# Patient Record
Sex: Male | Born: 2000 | Race: White | Hispanic: No | Marital: Single | Smoking: Never smoker
Health system: Southern US, Community
[De-identification: ages and names within clinical notes are randomized; demographics above are authoritative.]

## PROBLEM LIST (undated history)

## (undated) DIAGNOSIS — N5089 Other specified disorders of the male genital organs: Secondary | ICD-10-CM

## (undated) HISTORY — DX: Other specified disorders of the male genital organs: N50.89

---
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
## 2021-01-07 ENCOUNTER — Ambulatory Visit (INDEPENDENT_AMBULATORY_CARE_PROVIDER_SITE_OTHER): Payer: BC Managed Care – PPO | Admitting: Physician Assistant

## 2021-01-07 ENCOUNTER — Other Ambulatory Visit: Payer: Self-pay

## 2021-01-07 ENCOUNTER — Encounter: Payer: Self-pay | Admitting: Physician Assistant

## 2021-01-07 VITALS — BP 109/70 | HR 70 | Temp 98.3°F | Ht 72.0 in | Wt 174.5 lb

## 2021-01-07 DIAGNOSIS — Z025 Encounter for examination for participation in sport: Secondary | ICD-10-CM

## 2021-01-07 NOTE — Progress Notes (Signed)
Male sports physical   Impression and Recommendations:    1. Routine sports physical exam       1) Healthcare Maintenance: -Completed sports physical form. -Recommend to continue with reducing of vaping and eventually avoid use. -Recommend to avoid etoh use. -Continue to stay hydrated. -Follow up for annual physical in 6-12 months.    No orders of the defined types were placed in this encounter.   No orders of the defined types were placed in this encounter.    Return for CPE in 6-12 months.    Gross side effects, risk and benefits, and alternatives of medications discussed with patient.  Patient is aware that all medications have potential side effects and we are unable to predict every side effect or drug-drug interaction that may occur.  Expresses verbal understanding and consents to current therapy plan and treatment regimen.  Please see AVS handed out to patient at the end of our visit for further patient instructions/ counseling done pertaining to today's office visit.       Subjective:        CC: CPE   HPI: Phillip Martin is a 20 y.o. male who presents to [place] [facility] today for sports physical exam.     Health Maintenance Summary  - Reviewed and updated, unless pt declines services.  Tobacco History Reviewed:   Occasionally vapes Alcohol / drug use:    No concerns, reports will have beer or liquor at parties/ no use Exercise Habits:   Moderate to rigorous physical activity Male history: STD concerns:   none Additional penile/ urinary concerns: None   Additional concerns beyond Health Maintenance issues:   None    Immunization History  Administered Date(s) Administered   DTaP 12/26/2001, 02/13/2002, 05/21/2002, 05/27/2003, 03/07/2006   HPV 9-valent 01/21/2016, 08/03/2016, 06/21/2017   Hepatitis A 03/07/2006, 08/21/2007   Hepatitis B 2001-07-26, 11/22/2001, 08/28/2002   HiB (PRP-OMP) 12/26/2001, 02/13/2002,  05/21/2002, 10/23/2002   HiB (PRP-T) 12/26/2001, 02/13/2002, 05/21/2002, 10/23/2002   Hpv-Unspecified 01/21/2016, 08/03/2016   IPV 12/26/2001, 02/13/2002, 05/21/2002, 03/07/2006   Influenza,inj,Quad PF,6+ Mos 03/23/2016, 06/21/2017, 03/07/2019, 05/21/2020   Influenza-Unspecified 06/14/2013   MMR 10/23/2002, 03/07/2006   Meningococcal B, OMV 01/10/2018, 05/21/2020   Meningococcal Conjugate 01/04/2014   Pneumococcal Conjugate-13 12/26/2001, 02/13/2002, 05/21/2002, 05/27/2003   Pneumococcal-Unspecified 12/26/2001, 02/13/2002, 05/21/2002, 05/27/2003   Tdap 04/04/2012   Varicella 10/23/2002, 03/07/2006     Health Maintenance  Topic Date Due   HIV Screening  Never done   Hepatitis C Screening  Never done   INFLUENZA VACCINE  01/05/2021   TETANUS/TDAP  04/04/2022   Pneumococcal Vaccine 27-37 Years old  Completed   HPV VACCINES  Completed       Wt Readings from Last 3 Encounters:  01/07/21 174 lb 8 oz (79.2 kg) (78 %, Z= 0.76)*  05/21/20 166 lb 14.4 oz (75.7 kg) (73 %, Z= 0.60)*  03/07/19 165 lb 14.4 oz (75.3 kg) (78 %, Z= 0.78)*   * Growth percentiles are based on CDC (Boys, 2-20 Years) data.   BP Readings from Last 3 Encounters:  01/07/21 109/70  05/21/20 106/69  03/07/19 120/74 (59 %, Z = 0.23 /  70 %, Z = 0.52)*   *BP percentiles are based on the 2017 AAP Clinical Practice Guideline for boys   Pulse Readings from Last 3 Encounters:  01/07/21 70  05/21/20 (!) 50  03/07/19 61    Patient Active Problem List   Diagnosis Date Noted  Cough 02/13/2018   Health check for child over 18 days old 01/10/2018   Healthcare maintenance 06/21/2017    Past Medical History:  Diagnosis Date   Spermatic cord cyst     Past Surgical History:  Procedure Laterality Date   WISDOM TOOTH EXTRACTION      Family History  Problem Relation Age of Onset   Healthy Mother    Healthy Father    Healthy Brother    Heart attack Maternal Grandmother    Diabetes Maternal Grandmother      Social History   Substance and Sexual Activity  Drug Use No  ,  Social History   Substance and Sexual Activity  Alcohol Use No  ,  Social History   Tobacco Use  Smoking Status Never  Smokeless Tobacco Never  ,  Social History   Substance and Sexual Activity  Sexual Activity Yes   Birth control/protection: Condom    Patient's Medications   No medications on file    Patient has no known allergies.  Review of Systems: General:   Denies fever, chills, unexplained weight loss.  Optho/Auditory:   Denies visual changes, blurred vision/LOV Respiratory:   Denies SOB, DOE more than baseline levels.   Cardiovascular:   Denies chest pain, palpitations, new onset peripheral edema  Gastrointestinal:   Denies nausea, vomiting, diarrhea.  Genitourinary: Denies dysuria, freq/ urgency, flank pain or discharge from genitals.  Endocrine:     Denies hot or cold intolerance, polyuria, polydipsia. Musculoskeletal:   Denies unexplained myalgias, joint swelling, unexplained arthralgias, gait problems.  Skin:  Denies rash, suspicious lesions Neurological:     Denies dizziness, unexplained weakness, numbness  Psychiatric/Behavioral:   Denies mood changes, suicidal or homicidal ideations, hallucinations    Objective:     Blood pressure 109/70, pulse 70, temperature 98.3 F (36.8 C), height 6' (1.829 m), weight 174 lb 8 oz (79.2 kg), SpO2 98 %. Body mass index is 23.67 kg/m. General Appearance:    Alert, cooperative, no distress, appears stated age  Head:    Normocephalic, without obvious abnormality, atraumatic  Eyes:    PERRL, conjunctiva/corneas clear, EOM's intact, fundi    benign, both eyes  Ears:    Normal TM's and external ear canals, both ears  Nose:   Nares normal, septum midline, mucosa normal, no drainage    or sinus tenderness  Throat:   Lips w/o lesion, mucosa moist, and tongue normal; teeth and gums normal  Neck:   Supple, symmetrical, trachea midline, no adenopathy;     thyroid:  no enlargement/tenderness  Back:     Symmetric, no curvature, ROM normal, no CVA tenderness  Lungs:     Clear to auscultation bilaterally, respirations unlabored, no Wh/ R/ R  Chest Wall:    No tenderness or gross deformity; normal excursion   Heart:    Regular rate and rhythm, S1 and S2 normal, no murmur, rub   or gallop  Abdomen:     Soft, non-tender, bowel sounds active all four quadrants, No G/R/R, no masses, no organomegaly  Genitalia:    Ext genitalia: without lesion, no penile rash or discharge, no hernias appreciated, epididymal cyst (R>L) stable  Rectal:  Deferred  Extremities:   Extremities normal, atraumatic, no cyanosis or gross edema  Pulses:   2+ and symmetric all extremities  Skin:   Warm, dry, Skin color, texture, turgor normal, no obvious rashes or lesions  M-Sk:   Ambulates * 4 w/o difficulty, no gross deformities, tone WNL  Neurologic:  CNII-XII intact, normal strength, sensation and reflexes    Throughout Psych:  No HI/SI, judgement and insight good, Euthymic mood. Full Affect.

## 2021-01-13 ENCOUNTER — Other Ambulatory Visit: Payer: Self-pay

## 2021-01-13 ENCOUNTER — Other Ambulatory Visit: Payer: Self-pay | Admitting: Physician Assistant

## 2021-01-13 ENCOUNTER — Other Ambulatory Visit: Payer: BC Managed Care – PPO

## 2021-01-13 DIAGNOSIS — Z130 Encounter for screening for diseases of the blood and blood-forming organs and certain disorders involving the immune mechanism: Secondary | ICD-10-CM

## 2021-01-15 LAB — SICKLE CELL SCREEN: Sickle Cell Screen: NEGATIVE

## 2022-01-11 ENCOUNTER — Encounter: Payer: Self-pay | Admitting: Physician Assistant

## 2022-01-11 ENCOUNTER — Telehealth: Payer: Self-pay | Admitting: Physician Assistant

## 2022-01-11 ENCOUNTER — Ambulatory Visit (INDEPENDENT_AMBULATORY_CARE_PROVIDER_SITE_OTHER): Payer: BC Managed Care – PPO | Admitting: Physician Assistant

## 2022-01-11 VITALS — BP 117/75 | HR 160 | Temp 97.8°F | Ht 72.0 in | Wt 173.0 lb

## 2022-01-11 DIAGNOSIS — Z0001 Encounter for general adult medical examination with abnormal findings: Secondary | ICD-10-CM | POA: Diagnosis not present

## 2022-01-11 DIAGNOSIS — Z0000 Encounter for general adult medical examination without abnormal findings: Secondary | ICD-10-CM | POA: Diagnosis not present

## 2022-01-11 DIAGNOSIS — R000 Tachycardia, unspecified: Secondary | ICD-10-CM

## 2022-01-11 NOTE — Patient Instructions (Signed)
Preventive Care 21-20 Years Old, Male Preventive care refers to lifestyle choices and visits with your health care provider that can promote health and wellness. Preventive care visits are also called wellness exams. What can I expect for my preventive care visit? Counseling During your preventive care visit, your health care provider may ask about your: Medical history, including: Past medical problems. Family medical history. Current health, including: Emotional well-being. Home life and relationship well-being. Sexual activity. Lifestyle, including: Alcohol, nicotine or tobacco, and drug use. Access to firearms. Diet, exercise, and sleep habits. Safety issues such as seatbelt and bike helmet use. Sunscreen use. Work and work environment. Physical exam Your health care provider may check your: Height and weight. These may be used to calculate your BMI (body mass index). BMI is a measurement that tells if you are at a healthy weight. Waist circumference. This measures the distance around your waistline. This measurement also tells if you are at a healthy weight and may help predict your risk of certain diseases, such as type 2 diabetes and high blood pressure. Heart rate and blood pressure. Body temperature. Skin for abnormal spots. What immunizations do I need?  Vaccines are usually given at various ages, according to a schedule. Your health care provider will recommend vaccines for you based on your age, medical history, and lifestyle or other factors, such as travel or where you work. What tests do I need? Screening Your health care provider may recommend screening tests for certain conditions. This may include: Lipid and cholesterol levels. Diabetes screening. This is done by checking your blood sugar (glucose) after you have not eaten for a while (fasting). Hepatitis B test. Hepatitis C test. HIV (human immunodeficiency virus) test. STI (sexually transmitted infection)  testing, if you are at risk. Talk with your health care provider about your test results, treatment options, and if necessary, the need for more tests. Follow these instructions at home: Eating and drinking  Eat a healthy diet that includes fresh fruits and vegetables, whole grains, lean protein, and low-fat dairy products. Drink enough fluid to keep your urine pale yellow. Take vitamin and mineral supplements as recommended by your health care provider. Do not drink alcohol if your health care provider tells you not to drink. If you drink alcohol: Limit how much you have to 0-2 drinks a day. Know how much alcohol is in your drink. In the [place] one drink equals one 12 oz bottle of beer (355 mL), one 5 oz glass of wine (148 mL), or one 1 oz glass of hard liquor (44 mL). Lifestyle Brush your teeth every morning and night with fluoride toothpaste. Floss one time each day. Exercise for at least 30 minutes 5 or more days each week. Do not use any products that contain nicotine or tobacco. These products include cigarettes, chewing tobacco, and vaping devices, such as e-cigarettes. If you need help quitting, ask your health care provider. Do not use drugs. If you are sexually active, practice safe sex. Use a condom or other form of protection to prevent STIs. Find healthy ways to manage stress, such as: Meditation, yoga, or listening to music. Journaling. Talking to a trusted person. Spending time with friends and family. Minimize exposure to UV radiation to reduce your risk of skin cancer. Safety Always wear your seat belt while driving or riding in a vehicle. Do not drive: If you have been drinking alcohol. Do not ride with someone who has been drinking. If you have been using any mind-altering substances   or drugs. While texting. When you are tired or distracted. Wear a helmet and other protective equipment during sports activities. If you have firearms in your house, make sure you  follow all gun safety procedures. Seek help if you have been physically or sexually abused. What's next? Go to your health care provider once a year for an annual wellness visit. Ask your health care provider how often you should have your eyes and teeth checked. Stay up to date on all vaccines. This information is not intended to replace advice given to you by your health care provider. Make sure you discuss any questions you have with your health care provider. Document Revised: 11/19/2020 Document Reviewed: 11/19/2020 Elsevier Patient Education  2023 Elsevier Inc.  

## 2022-01-11 NOTE — Telephone Encounter (Signed)
Patient's mother had left a vm while we were at lunch stating she wants to speak to someone about his diagnosis of tachycardia. She states he had just got done working out this morning before the appointment and he may or may not had caffeine prior to visit and she just doesn't want it to be on his "record" if it's not a true diagnosis. Please advise.

## 2022-01-11 NOTE — Telephone Encounter (Signed)
Patient was made fully aware and verbalized his understanding. Mother was on speaker phone with him and he approved it was ok to discuss findings. He returns to college tomorrow and he stated he would have to find a cardiologist near there.

## 2022-01-11 NOTE — Progress Notes (Signed)
Complete physical exam   Patient: Phillip Martin   DOB: Mar 24, 2001   21 y.o. Male  MRN: [id number] Visit Date: 01/11/2022   Chief Complaint  Patient presents with   Follow-up    physcial   Subjective    Phillip Martin is a 21 y.o. male who presents today for a complete physical exam.  He reports consuming a general diet. Gym/ health club routine includes cardio and strength training. He generally feels well. He does not have additional problems to discuss today.     Past Medical History:  Diagnosis Date   Spermatic cord cyst    Past Surgical History:  Procedure Laterality Date   WISDOM TOOTH EXTRACTION     Social History   Socioeconomic History   Marital status: Single    Spouse name: Not on file   Number of children: Not on file   Years of education: Not on file   Highest education level: Not on file  Occupational History   Not on file  Tobacco Use   Smoking status: Never   Smokeless tobacco: Never  Vaping Use   Vaping Use: Some days   Substances: Nicotine  Substance and Sexual Activity   Alcohol use: No   Drug use: No   Sexual activity: Yes    Birth control/protection: Condom  Other Topics Concern   Not on file  Social History Narrative   Not on file   Social Determinants of Health   Financial Resource Strain: Not on file  Food Insecurity: Not on file  Transportation Needs: Not on file  Physical Activity: Not on file  Stress: Not on file  Social Connections: Not on file  Intimate Partner Violence: Not on file     Medications: No outpatient medications prior to visit.   No facility-administered medications prior to visit.    Review of Systems Review of Systems:  A fourteen system review of systems was performed and found to be positive as per HPI.   Last CBC Lab Results  Component Value Date   WBC 8.8 12/13/2017   HGB 14.9 12/13/2017   HCT 42.9 12/13/2017   MCV 86 12/13/2017   MCH 29.7 12/13/2017   RDW 11.9 (L) 12/13/2017   PLT 263  52/84/1324   Last metabolic panel Lab Results  Component Value Date   GLUCOSE 91 12/13/2017   NA 141 12/13/2017   K 4.5 12/13/2017   CL 103 12/13/2017   CO2 24 12/13/2017   BUN 15 12/13/2017   CREATININE 0.88 12/13/2017   GFRNONAA CANCELED 12/13/2017   CALCIUM 9.8 12/13/2017   PROT 6.9 12/13/2017   ALBUMIN 4.4 12/13/2017   LABGLOB 2.5 12/13/2017   AGRATIO 1.8 12/13/2017   BILITOT 0.4 12/13/2017   ALKPHOS 108 12/13/2017   AST 29 12/13/2017   ALT 23 12/13/2017   Last lipids No results found for: "CHOL", "HDL", "LDLCALC", "LDLDIRECT", "TRIG", "CHOLHDL" Last hemoglobin A1c No results found for: "HGBA1C" Last thyroid functions No results found for: "TSH", "T3TOTAL", "T4TOTAL", "THYROIDAB" Last vitamin D No results found for: "25OHVITD2", "25OHVITD3", "VD25OH"  Objective     BP 117/75   Pulse (!) 160   Temp 97.8 F (36.6 C) (Temporal)   Ht 6' (1.829 m)   Wt 173 lb (78.5 kg)   BMI 23.46 kg/m  BP Readings from Last 3 Encounters:  01/11/22 117/75  01/07/21 109/70  05/21/20 106/69   Wt Readings from Last 3 Encounters:  01/11/22 173 lb (78.5 kg)  01/07/21 174 lb 8 oz (  79.2 kg) (78 %, Z= 0.76)*  05/21/20 166 lb 14.4 oz (75.7 kg) (73 %, Z= 0.60)*   * Growth percentiles are based on CDC (Boys, 2-20 Years) data.    Physical Exam   General Appearance:    Well developed, well nourished male. Alert, cooperative, in no acute distress, appears stated age  Head:    Normocephalic, without obvious abnormality, atraumatic  Eyes:    PERRL, conjunctiva/corneas clear, EOM's intact, fundi    benign, both eyes       Ears:    Normal TM's and external ear canals, both ears  Nose:   Nares normal, septum midline, mucosa normal, no drainage   or sinus tenderness  Throat:   Lips, mucosa, and tongue normal; teeth and gums normal  Neck:   Supple, symmetrical, trachea midline, no adenopathy;       thyroid:  No enlargement/tenderness/nodules; no JVD  Back:     Symmetric, no curvature, ROM  normal, no CVA tenderness  Lungs:     Clear to auscultation bilaterally, respirations unlabored  Chest wall:    No tenderness or deformity  Heart:    Tachycardic. Irregular rhythm. No murmurs, rubs, or gallops.  S1 and S2 normal  Abdomen:     Soft, non-tender, bowel sounds active all four quadrants,    no masses, no organomegaly  Genitalia:    deferred  Rectal:    deferred  Extremities:   All extremities are intact. No cyanosis or edema  Pulses:   2+ and symmetric all extremities  Skin:   Skin color, texture, turgor normal, no rashes or lesions  Lymph nodes:   Cervical and supraclavicular nodes normal  Neurologic:   CNII-XII grossly intact.     Last depression screening scores    01/11/2022    9:58 AM 01/07/2021    1:31 PM 05/21/2020    8:11 AM  PHQ 2/9 Scores  PHQ - 2 Score 0 0 0  PHQ- 9 Score 0 0 0   Last fall risk screening    01/11/2022    9:58 AM  Fall Risk   Falls in the past year? 0  Number falls in past yr: 0  Injury with Fall? 0  Risk for fall due to : No Fall Risks  Follow up Falls evaluation completed     No results found for any visits on 01/11/22.  Assessment & Plan    Routine Health Maintenance and Physical Exam  Exercise Activities and Dietary recommendations -Discussed heart healthy diet low in fat and carbohydrates. Recommend moderate exercise 150 mins/wk.  Immunization History  Administered Date(s) Administered   DTaP 12/26/2001, 02/13/2002, 05/21/2002, 05/27/2003, 03/07/2006   HPV 9-valent 01/21/2016, 08/03/2016, 06/21/2017   Hepatitis A 03/07/2006, 08/21/2007   Hepatitis B 09/09/2001, 11/22/2001, 08/28/2002   HiB (PRP-OMP) 12/26/2001, 02/13/2002, 05/21/2002, 10/23/2002   HiB (PRP-T) 12/26/2001, 02/13/2002, 05/21/2002, 10/23/2002   Hpv-Unspecified 01/21/2016, 08/03/2016   IPV 12/26/2001, 02/13/2002, 05/21/2002, 03/07/2006   Influenza,inj,Quad PF,6+ Mos 03/23/2016, 06/21/2017, 03/07/2019, 05/21/2020   Influenza-Unspecified 06/14/2013   MMR  10/23/2002, 03/07/2006   Meningococcal B, OMV 01/10/2018, 05/21/2020   Meningococcal Conjugate 01/04/2014   Pneumococcal Conjugate-13 12/26/2001, 02/13/2002, 05/21/2002, 05/27/2003   Pneumococcal-Unspecified 12/26/2001, 02/13/2002, 05/21/2002, 05/27/2003   Tdap 04/04/2012   Varicella 10/23/2002, 03/07/2006    Health Maintenance  Topic Date Due   HIV Screening  Never done   Hepatitis C Screening  Never done   INFLUENZA VACCINE  01/05/2022   TETANUS/TDAP  04/04/2022   HPV VACCINES  Completed  Discussed health benefits of physical activity, and encouraged him to engage in regular exercise appropriate for his age and condition.  Problem List Items Addressed This Visit   None Visit Diagnoses     Encounter for annual general medical examination with abnormal findings in adult    -  Primary   Tachycardia       Relevant Orders   EKG 12-Lead      EKG obtained for elevated pulse and slight irregular rhythm on exam. Patient is asymptomatic- denies cardiac or pulm symptoms. Discussed with patient EKG findings: NSR with sinus arrhythmia, rightward axis, rate 72 bpm, no acute ST-T wave changes. Discussed NSR with sinus arrhythmia could be a normal variation for age but recommend further cardiology work-up. Provided conditional clearance to participate in sports limiting strenuous/rigorous activity until cleared by cardiology. Patient is leaving to [employer] college tomorrow so advised to let me know where to send cardiology referral if needed. Patient denies use of steroids, work-out supplements, or excessive caffeine intake. Does report vape use and advised to abstain.   Return in about 1 year (around 01/12/2023) for CPE.       Lorrene Reid, PA-C  [facility] [phone] (phone) [phone] (fax)  [place]

## 2022-01-14 NOTE — Telephone Encounter (Signed)
Patient aware.

## 2022-01-14 NOTE — Telephone Encounter (Signed)
Please have patient find a cardiology office near his campus and advise where he would like referral sent.

## 2023-04-28 DIAGNOSIS — M238X9 Other internal derangements of unspecified knee: Secondary | ICD-10-CM | POA: Diagnosis not present

## 2023-04-28 DIAGNOSIS — M2391 Unspecified internal derangement of right knee: Secondary | ICD-10-CM | POA: Diagnosis not present

## 2023-04-28 DIAGNOSIS — M25461 Effusion, right knee: Secondary | ICD-10-CM | POA: Diagnosis not present

## 2023-04-28 DIAGNOSIS — M25361 Other instability, right knee: Secondary | ICD-10-CM | POA: Diagnosis not present

## 2023-04-28 DIAGNOSIS — S8991XA Unspecified injury of right lower leg, initial encounter: Secondary | ICD-10-CM | POA: Diagnosis not present

## 2023-05-03 DIAGNOSIS — M2391 Unspecified internal derangement of right knee: Secondary | ICD-10-CM | POA: Diagnosis not present

## 2023-05-03 DIAGNOSIS — M898X6 Other specified disorders of bone, lower leg: Secondary | ICD-10-CM | POA: Diagnosis not present

## 2023-05-03 DIAGNOSIS — S8991XA Unspecified injury of right lower leg, initial encounter: Secondary | ICD-10-CM | POA: Diagnosis not present

## 2023-05-13 DIAGNOSIS — S82831A Other fracture of upper and lower end of right fibula, initial encounter for closed fracture: Secondary | ICD-10-CM | POA: Diagnosis not present

## 2023-05-13 DIAGNOSIS — S8001XA Contusion of right knee, initial encounter: Secondary | ICD-10-CM | POA: Diagnosis not present

## 2023-05-13 DIAGNOSIS — S83421A Sprain of lateral collateral ligament of right knee, initial encounter: Secondary | ICD-10-CM | POA: Diagnosis not present

## 2023-05-13 DIAGNOSIS — S83411A Sprain of medial collateral ligament of right knee, initial encounter: Secondary | ICD-10-CM | POA: Diagnosis not present

## 2023-05-18 DIAGNOSIS — S8291XA Unspecified fracture of right lower leg, initial encounter for closed fracture: Secondary | ICD-10-CM | POA: Diagnosis not present

## 2023-05-26 ENCOUNTER — Ambulatory Visit: Payer: BC Managed Care – PPO | Admitting: Orthopedic Surgery

## 2023-05-31 DIAGNOSIS — M25661 Stiffness of right knee, not elsewhere classified: Secondary | ICD-10-CM | POA: Diagnosis not present

## 2023-05-31 DIAGNOSIS — M6281 Muscle weakness (generalized): Secondary | ICD-10-CM | POA: Diagnosis not present

## 2023-05-31 DIAGNOSIS — M25561 Pain in right knee: Secondary | ICD-10-CM | POA: Diagnosis not present

## 2023-06-02 DIAGNOSIS — M6281 Muscle weakness (generalized): Secondary | ICD-10-CM | POA: Diagnosis not present

## 2023-06-02 DIAGNOSIS — M25661 Stiffness of right knee, not elsewhere classified: Secondary | ICD-10-CM | POA: Diagnosis not present

## 2023-06-02 DIAGNOSIS — M25561 Pain in right knee: Secondary | ICD-10-CM | POA: Diagnosis not present

## 2023-06-06 DIAGNOSIS — M25561 Pain in right knee: Secondary | ICD-10-CM | POA: Diagnosis not present

## 2023-06-06 DIAGNOSIS — M25661 Stiffness of right knee, not elsewhere classified: Secondary | ICD-10-CM | POA: Diagnosis not present

## 2023-06-06 DIAGNOSIS — M6281 Muscle weakness (generalized): Secondary | ICD-10-CM | POA: Diagnosis not present

## 2023-06-09 DIAGNOSIS — M25661 Stiffness of right knee, not elsewhere classified: Secondary | ICD-10-CM | POA: Diagnosis not present

## 2023-06-09 DIAGNOSIS — M25561 Pain in right knee: Secondary | ICD-10-CM | POA: Diagnosis not present

## 2023-06-09 DIAGNOSIS — M6281 Muscle weakness (generalized): Secondary | ICD-10-CM | POA: Diagnosis not present

## 2023-06-15 DIAGNOSIS — S8291XA Unspecified fracture of right lower leg, initial encounter for closed fracture: Secondary | ICD-10-CM | POA: Diagnosis not present

## 2023-06-21 DIAGNOSIS — X58XXXD Exposure to other specified factors, subsequent encounter: Secondary | ICD-10-CM | POA: Diagnosis not present

## 2023-06-21 DIAGNOSIS — S8291XD Unspecified fracture of right lower leg, subsequent encounter for closed fracture with routine healing: Secondary | ICD-10-CM | POA: Diagnosis not present

## 2023-06-21 DIAGNOSIS — M79604 Pain in right leg: Secondary | ICD-10-CM | POA: Diagnosis not present

## 2023-06-28 DIAGNOSIS — X58XXXD Exposure to other specified factors, subsequent encounter: Secondary | ICD-10-CM | POA: Diagnosis not present

## 2023-06-28 DIAGNOSIS — M79604 Pain in right leg: Secondary | ICD-10-CM | POA: Diagnosis not present

## 2023-06-28 DIAGNOSIS — S8291XD Unspecified fracture of right lower leg, subsequent encounter for closed fracture with routine healing: Secondary | ICD-10-CM | POA: Diagnosis not present

## 2023-07-01 DIAGNOSIS — M79604 Pain in right leg: Secondary | ICD-10-CM | POA: Diagnosis not present

## 2023-07-01 DIAGNOSIS — X58XXXD Exposure to other specified factors, subsequent encounter: Secondary | ICD-10-CM | POA: Diagnosis not present

## 2023-07-01 DIAGNOSIS — S8291XD Unspecified fracture of right lower leg, subsequent encounter for closed fracture with routine healing: Secondary | ICD-10-CM | POA: Diagnosis not present

## 2023-07-05 DIAGNOSIS — S8291XD Unspecified fracture of right lower leg, subsequent encounter for closed fracture with routine healing: Secondary | ICD-10-CM | POA: Diagnosis not present

## 2023-07-05 DIAGNOSIS — M79604 Pain in right leg: Secondary | ICD-10-CM | POA: Diagnosis not present

## 2023-07-05 DIAGNOSIS — X58XXXD Exposure to other specified factors, subsequent encounter: Secondary | ICD-10-CM | POA: Diagnosis not present

## 2023-07-08 DIAGNOSIS — M79604 Pain in right leg: Secondary | ICD-10-CM | POA: Diagnosis not present

## 2023-07-08 DIAGNOSIS — S8291XD Unspecified fracture of right lower leg, subsequent encounter for closed fracture with routine healing: Secondary | ICD-10-CM | POA: Diagnosis not present

## 2023-07-08 DIAGNOSIS — X58XXXD Exposure to other specified factors, subsequent encounter: Secondary | ICD-10-CM | POA: Diagnosis not present

## 2023-07-12 DIAGNOSIS — X58XXXD Exposure to other specified factors, subsequent encounter: Secondary | ICD-10-CM | POA: Diagnosis not present

## 2023-07-12 DIAGNOSIS — S8291XD Unspecified fracture of right lower leg, subsequent encounter for closed fracture with routine healing: Secondary | ICD-10-CM | POA: Diagnosis not present

## 2023-07-12 DIAGNOSIS — M79604 Pain in right leg: Secondary | ICD-10-CM | POA: Diagnosis not present

## 2023-07-14 DIAGNOSIS — S8291XD Unspecified fracture of right lower leg, subsequent encounter for closed fracture with routine healing: Secondary | ICD-10-CM | POA: Diagnosis not present

## 2023-07-14 DIAGNOSIS — X58XXXD Exposure to other specified factors, subsequent encounter: Secondary | ICD-10-CM | POA: Diagnosis not present

## 2023-07-14 DIAGNOSIS — M79604 Pain in right leg: Secondary | ICD-10-CM | POA: Diagnosis not present

## 2023-08-04 DIAGNOSIS — M79604 Pain in right leg: Secondary | ICD-10-CM | POA: Diagnosis not present

## 2023-08-04 DIAGNOSIS — S8291XD Unspecified fracture of right lower leg, subsequent encounter for closed fracture with routine healing: Secondary | ICD-10-CM | POA: Diagnosis not present

## 2023-08-04 DIAGNOSIS — X58XXXD Exposure to other specified factors, subsequent encounter: Secondary | ICD-10-CM | POA: Diagnosis not present

## 2023-08-24 DIAGNOSIS — X58XXXD Exposure to other specified factors, subsequent encounter: Secondary | ICD-10-CM | POA: Diagnosis not present

## 2023-08-24 DIAGNOSIS — M79604 Pain in right leg: Secondary | ICD-10-CM | POA: Diagnosis not present

## 2023-08-24 DIAGNOSIS — S8291XD Unspecified fracture of right lower leg, subsequent encounter for closed fracture with routine healing: Secondary | ICD-10-CM | POA: Diagnosis not present

## 2024-04-09 ENCOUNTER — Encounter: Payer: Self-pay | Admitting: Radiology
